# Patient Record
Sex: Male | Born: 1980 | Race: White | Hispanic: No | Smoking: Current every day smoker
Health system: Southern US, Community
[De-identification: ages and names within clinical notes are randomized; demographics above are authoritative.]

## PROBLEM LIST (undated history)

## (undated) DIAGNOSIS — R569 Unspecified convulsions: Secondary | ICD-10-CM

## (undated) HISTORY — PX: INGUINAL HERNIA REPAIR: SHX194

---

## 2014-10-10 ENCOUNTER — Encounter (HOSPITAL_COMMUNITY): Payer: Self-pay | Admitting: Emergency Medicine

## 2014-10-10 ENCOUNTER — Emergency Department (HOSPITAL_COMMUNITY)
Admission: EM | Admit: 2014-10-10 | Discharge: 2014-10-10 | Disposition: A | Discharge status: Home / Self Care | Payer: Self-pay | Attending: Emergency Medicine | Admitting: Emergency Medicine

## 2014-10-10 DIAGNOSIS — Z792 Long term (current) use of antibiotics: Secondary | ICD-10-CM | POA: Insufficient documentation

## 2014-10-10 DIAGNOSIS — K088 Other specified disorders of teeth and supporting structures: Secondary | ICD-10-CM | POA: Insufficient documentation

## 2014-10-10 DIAGNOSIS — R51 Headache: Secondary | ICD-10-CM | POA: Insufficient documentation

## 2014-10-10 DIAGNOSIS — Z720 Tobacco use: Secondary | ICD-10-CM | POA: Insufficient documentation

## 2014-10-10 DIAGNOSIS — H53149 Visual discomfort, unspecified: Secondary | ICD-10-CM | POA: Insufficient documentation

## 2014-10-10 DIAGNOSIS — R519 Headache, unspecified: Secondary | ICD-10-CM

## 2014-10-10 DIAGNOSIS — K0889 Other specified disorders of teeth and supporting structures: Secondary | ICD-10-CM

## 2014-10-10 DIAGNOSIS — Z8669 Personal history of other diseases of the nervous system and sense organs: Secondary | ICD-10-CM | POA: Insufficient documentation

## 2014-10-10 HISTORY — DX: Unspecified convulsions: R56.9

## 2014-10-10 MED ORDER — HYDROCODONE-ACETAMINOPHEN 5-325 MG PO TABS
1.0000 | ORAL_TABLET | Freq: Once | ORAL | Status: AC
  Administered 2014-10-10: 1 via ORAL
  Filled 2014-10-10: qty 1

## 2014-10-10 MED ORDER — HYDROCODONE-ACETAMINOPHEN 5-325 MG PO TABS: 1.0000 | ORAL_TABLET | ORAL | Status: DC | PRN

## 2014-10-10 MED ORDER — AMOXICILLIN 500 MG PO CAPS: 500.0000 mg | ORAL_CAPSULE | Freq: Three times a day (TID) | ORAL | Status: AC

## 2014-10-10 MED ORDER — HYDROMORPHONE HCL 1 MG/ML IJ SOLN
1.0000 mg | Freq: Once | INTRAMUSCULAR | Status: AC
Start: 2014-10-10 — End: 2014-10-10
  Administered 2014-10-10: 1 mg via INTRAMUSCULAR
  Filled 2014-10-10: qty 1

## 2014-10-10 MED ORDER — AMOXICILLIN 250 MG PO CAPS
500.0000 mg | ORAL_CAPSULE | Freq: Once | ORAL | Status: AC
  Administered 2014-10-10: 500 mg via ORAL
  Filled 2014-10-10: qty 2

## 2014-10-10 NOTE — ED Notes (Signed)
Pt verbalized understanding of no driving and to use caution within 4 hours of taking pain meds due to meds cause drowsiness 

## 2014-10-10 NOTE — ED Notes (Signed)
Pt states that he has had a bad headache for the past 4-5 days.

## 2014-10-10 NOTE — Discharge Instructions (Signed)
Dental Pain A tooth ache may be caused by cavities (tooth decay). Cavities expose the nerve of the tooth to air and hot or cold temperatures. It may come from an infection or abscess (also called a boil or furuncle) around your tooth. It is also often caused by dental caries (tooth decay). This causes the pain you are having. DIAGNOSIS  Your caregiver can diagnose this problem by exam. TREATMENT   If caused by an infection, it may be treated with medications which kill germs (antibiotics) and pain medications as prescribed by your caregiver. Take medications as directed.  Only take over-the-counter or prescription medicines for pain, discomfort, or fever as directed by your caregiver.  Whether the tooth ache today is caused by infection or dental disease, you should see your dentist as soon as possible for further care. SEEK MEDICAL CARE IF: The exam and treatment you received today has been provided on an emergency basis only. This is not a substitute for complete medical or dental care. If your problem worsens or new problems (symptoms) appear, and you are unable to meet with your dentist, call or return to this location. SEEK IMMEDIATE MEDICAL CARE IF:   You have a fever.  You develop redness and swelling of your face, jaw, or neck.  You are unable to open your mouth.  You have severe pain uncontrolled by pain medicine. MAKE SURE YOU:   Understand these instructions.  Will watch your condition.  Will get help right away if you are not doing well or get worse. Document Released: 05/13/2005 Document Revised: 08/05/2011 Document Reviewed: 12/30/2007 [facility] Patient Information 2015 [place], [place]. This information is not intended to replace advice given to you by your health care provider. Make sure you discuss any questions you have with your health care provider.  General Headache Without Cause A headache is pain or discomfort felt around the head or neck area. The specific cause  of a headache may not be found. There are many causes and types of headaches. A few common ones are:  Tension headaches.  Migraine headaches.  Cluster headaches.  Chronic daily headaches. HOME CARE INSTRUCTIONS   Keep all follow-up appointments with your caregiver or any specialist referral.  Only take over-the-counter or prescription medicines for pain or discomfort as directed by your caregiver.  Lie down in a dark, quiet room when you have a headache.  Keep a headache journal to find out what may trigger your migraine headaches. For example, write down:  What you eat and drink.  How much sleep you get.  Any change to your diet or medicines.  Try massage or other relaxation techniques.  Put ice packs or heat on the head and neck. Use these 3 to 4 times per day for 15 to 20 minutes each time, or as needed.  Limit stress.  Sit up straight, and do not tense your muscles.  Quit smoking if you smoke.  Limit alcohol use.  Decrease the amount of caffeine you drink, or stop drinking caffeine.  Eat and sleep on a regular schedule.  Get 7 to 9 hours of sleep, or as recommended by your caregiver.  Keep lights dim if bright lights bother you and make your headaches worse. SEEK MEDICAL CARE IF:   You have problems with the medicines you were prescribed.  Your medicines are not working.  You have a change from the usual headache.  You have nausea or vomiting. SEEK IMMEDIATE MEDICAL CARE IF:   Your headache becomes severe.  You have a fever.  You have a stiff neck.  You have loss of vision.  You have muscular weakness or loss of muscle control.  You start losing your balance or have trouble walking.  You feel faint or pass out.  You have severe symptoms that are different from your first symptoms. MAKE SURE YOU:   Understand these instructions.  Will watch your condition.  Will get help right away if you are not doing well or get worse. Document  Released: 05/13/2005 Document Revised: 08/05/2011 Document Reviewed: 05/29/2011 [facility] Patient Information 2015 [place], [place]. This information is not intended to replace advice given to you by your health care provider. Make sure you discuss any questions you have with your health care provider.   You may take the hydrocodone prescribed for pain relief.  This will make you drowsy - do not drive within 4 hours of taking this medication.  Complete your entire course of antibiotics as prescribed.   Avoid applying heat or ice to this abscess area which can worsen your symptoms.  You may use warm salt water swish and spit treatment or half peroxide and water swish and spit after meals to keep this area clean as discussed.  Call a dentist from the referral sheet given for further management of your dental problem.

## 2014-10-11 NOTE — ED Provider Notes (Signed)
CSN: [id number]     Arrival date & time 10/10/14  1649 History   First MD Initiated Contact with Patient 10/10/14 2054     Chief Complaint  Patient presents with  . Headache     (Consider location/radiation/quality/duration/timing/severity/associated sxs/prior Treatment) The history is provided by the patient and the spouse.   Brandon Sutton is a 34 y.o. male with a history of seizure disorder and chronic migraine headaches presenting with headache that has been present for the past 4-5 days. He states his symptoms are similar to prior headaches.  The patients symptoms are not preceded by prodromal symptoms.  The patient has left sided pain in association with photophobia.  There has been no fevers, chills, syncope, confusion or localized weakness.  The patient has tried tylenol and motrin without relief of symptoms.  His last seizure was over 6 years ago. He is on depakote, [place].  He also reports dental pain also starting 4 days ago while eating popcorn, stating this may have triggered the migraine as well.  He has areas of dental decay and has gingival swelling around his left upper canine tooth since biting into a popcorn kernel.        Past Medical History  Diagnosis Date  . Seizures    History reviewed. No pertinent past surgical history. History reviewed. No pertinent family history. History  Substance Use Topics  . Smoking status: Current Every Day Smoker -- 1.00 packs/day    Types: Cigarettes  . Smokeless tobacco: Not on file  . Alcohol Use: No    Review of Systems  Constitutional: Negative for fever and chills.  HENT: Positive for dental problem. Negative for facial swelling and sore throat.   Eyes: Positive for photophobia.  Respiratory: Negative for shortness of breath.   Musculoskeletal: Negative for neck pain and neck stiffness.  Neurological: Positive for headaches. Negative for dizziness, seizures, facial asymmetry,  weakness and numbness.      Allergies  Erythromycin  Home Medications   Prior to Admission medications   Medication Sig Start Date End Date Taking? Authorizing Provider  amoxicillin (AMOXIL) 500 MG capsule Take 1 capsule (500 mg total) by mouth 3 (three) times daily. 10/10/14 10/20/14  Burgess AmorJulie Topaz Raglin, PA-C  HYDROcodone-acetaminophen (NORCO/VICODIN) 5-325 MG per tablet Take 1 tablet by mouth every 4 (four) hours as needed. 10/10/14   Burgess AmorJulie Brewer Hitchman, PA-C   BP 128/77 mmHg  Pulse 73  Temp(Src) 98.6 F (37 C) (Oral)  Resp 18  Ht 5\' 8"  (1.727 m)  Wt 122 lb (55.339 kg)  BMI 18.55 kg/m2  SpO2 99% Physical Exam  Constitutional: He is oriented to person, place, and time. He appears well-developed and well-nourished.  Uncomfortable appearing  HENT:  Head: Normocephalic and atraumatic.  Mouth/Throat: Oropharynx is clear and moist. No trismus in the jaw. Abnormal dentition.    Eyes: EOM are normal. Pupils are equal, round, and reactive to light.  Neck: Normal range of motion. Neck supple.  Cardiovascular: Normal rate and normal heart sounds.   Pulmonary/Chest: Effort normal.  Abdominal: Soft. There is no tenderness.  Musculoskeletal: Normal range of motion.  Lymphadenopathy:    He has no cervical adenopathy.  Neurological: He is alert and oriented to person, place, and time. He has normal strength. No sensory deficit. Gait normal. GCS eye subscore is 4. GCS verbal subscore is 5. GCS motor subscore is 6.  Normal heel-shin, normal rapid alternating movements. Cranial nerves III-XII intact.  No pronator drift.  Skin: Skin is warm and dry. No rash noted.  Psychiatric: He has a normal mood and affect. His speech is normal and behavior is normal. Thought content normal. Cognition and memory are normal.  Nursing note and vitals reviewed.   ED Course  Procedures (including critical care time) Labs Review Labs Reviewed - No data to display  Imaging Review No results found.   EKG  Interpretation None      MDM   Final diagnoses:  Pain, dental  Acute nonintractable headache, unspecified headache type    Medications  HYDROcodone-acetaminophen (NORCO/VICODIN) 5-325 MG per tablet 1 tablet (1 tablet Oral Given 10/10/14 2120)  amoxicillin (AMOXIL) capsule 500 mg (500 mg Oral Given 10/10/14 2120)  HYDROmorphone (DILAUDID) injection 1 mg (1 mg Intramuscular Given 10/10/14 2208)   Pt deferred IM meds, so given hydrocodone tab with no pain relief.  Agreed to dilaudid injection, pain significantly improved by time of dc.  Will cover for probable early dental abscess. Amoxil started.  Prescribed hydrocodone if pain persists.  Dental referrals given, also referral to local neurology as pt has recently moved to this area.   No neuro deficit on exam or by history to suggest emergent presentation.           Burgess AmorJulie Chloie Loney, PA-C 10/11/14 1339  Nelva Nayobert Beaton, MD 10/13/14 [phone]

## 2018-08-15 ENCOUNTER — Other Ambulatory Visit: Payer: Self-pay

## 2018-08-15 ENCOUNTER — Encounter (HOSPITAL_COMMUNITY): Payer: Self-pay | Admitting: *Deleted

## 2018-08-15 ENCOUNTER — Emergency Department (HOSPITAL_COMMUNITY)
Admission: EM | Admit: 2018-08-15 | Discharge: 2018-08-15 | Disposition: A | Discharge status: Home / Self Care | Payer: BLUE CROSS/BLUE SHIELD | Attending: Emergency Medicine | Admitting: Emergency Medicine

## 2018-08-15 DIAGNOSIS — F17210 Nicotine dependence, cigarettes, uncomplicated: Secondary | ICD-10-CM | POA: Insufficient documentation

## 2018-08-15 DIAGNOSIS — R1084 Generalized abdominal pain: Secondary | ICD-10-CM | POA: Insufficient documentation

## 2018-08-15 DIAGNOSIS — Z7689 Persons encountering health services in other specified circumstances: Secondary | ICD-10-CM | POA: Diagnosis not present

## 2018-08-15 NOTE — ED Provider Notes (Signed)
[facility] EMERGENCY DEPARTMENT Provider Note   CSN: [id number] Arrival date & time: 08/15/18  1136    History   Chief Complaint Chief Complaint  Patient presents with  . Letter for School/Work    HPI Brandon Sutton is a 38 y.o. male with history of seizures presenting to emergency department today for chief complaint of requesting a work note.  Patient states he had intermittent cramping generalized abdominal pain,  2 episodes of non bloody and non bilious emesis, 3 episodes of diarrhea x3 days ago.  He stayed home from work.  He took Tylenol and Imodium and his symptoms improved.  Patient states in order to return to work he needs a note saying he does not have coronavirus.  Pt denies any pain or current symptoms.   Patient denies fever, chills, cough, sore throat, shortness of breath, abdominal pain, chest pain, urinary symptoms, nausea, vomiting.  History provided by patient.    Past Medical History:  Diagnosis Date  . Seizures (HCC)     There are no active problems to display for this patient.   History reviewed. No pertinent surgical history.      Home Medications    Prior to Admission medications   Medication Sig Start Date End Date Taking? Authorizing Provider  HYDROcodone-acetaminophen (NORCO/VICODIN) 5-325 MG per tablet Take 1 tablet by mouth every 4 (four) hours as needed. 10/10/14   Burgess Amor, PA-C    Family History History reviewed. No pertinent family history.  Social History Social History   Tobacco Use  . Smoking status: Current Every Day Smoker    Packs/day: 1.00    Types: Cigarettes  . Smokeless tobacco: Never Used  Substance Use Topics  . Alcohol use: No  . Drug use: No     Allergies   Erythromycin   Review of Systems Review of Systems  All other systems reviewed and are negative.    Physical Exam Updated Vital Signs BP (!) 119/97 (BP Location: Right Arm)   Pulse 76   Temp 98.3 F (36.8 C) (Oral)   Resp  17   Ht 5\' 8"  (1.727 m)   Wt 65.8 kg   SpO2 99%   BMI 22.05 kg/m   Physical Exam Vitals signs and nursing note reviewed.  Constitutional:      General: He is not in acute distress.    Appearance: He is well-developed. He is not ill-appearing or toxic-appearing.  HENT:     Head: Normocephalic and atraumatic.     Nose: Nose normal.  Eyes:     General: No scleral icterus.       Right eye: No discharge.        Left eye: No discharge.     Conjunctiva/sclera: Conjunctivae normal.  Neck:     Musculoskeletal: Normal range of motion.  Cardiovascular:     Rate and Rhythm: Normal rate and regular rhythm.     Pulses: Normal pulses.     Heart sounds: Normal heart sounds.  Pulmonary:     Effort: Pulmonary effort is normal.     Breath sounds: Normal breath sounds.  Abdominal:     General: There is no distension.     Palpations: Abdomen is soft.     Tenderness: There is no abdominal tenderness. There is no guarding or rebound.  Musculoskeletal: Normal range of motion.  Skin:    General: Skin is warm and dry.  Neurological:     Mental Status: He is oriented to person, place, and  time.     Comments: Fluent speech, no facial droop.  Psychiatric:        Behavior: Behavior normal.      ED Treatments / Results  Labs (all labs ordered are listed, but only abnormal results are displayed) Labs Reviewed - No data to display  EKG None  Radiology No results found.  Procedures Procedures (including critical care time)  Medications Ordered in ED Medications - No data to display   Initial Impression / Assessment and Plan / ED Course  I have reviewed the triage vital signs and the nursing notes.  Pertinent labs & imaging results that were available during my care of the patient were reviewed by me and considered in my medical decision making (see chart for details).   Pt is afebrile, well appearing, in no acute distress. He had abdominal pain, nausea, 2 episodes non bilious an  non bloody emesis, and 3 episodes of non bloody diarrhea that all resolved after Tylenol and 1 dose of imodium. Doubt need for further emergent work up at this time. I have given explicit precautions to return to the ER including for any other new or worsening symptoms. The patient understands and accepts the medical plan as it's been dictated and I have answered their questions. Discharge instructions concerning home care and prescriptions have been given. The patient is STABLE and is discharged to home in good condition.  Patient provided with a note to return to work.  Given that he has no recent travel, respiratory symptoms, or known Covid-19 exposures do not feel that he needs testing at this time.   Advised pt to monitor blood pressure at home and follow up with pcp if needed.   This note was prepared with assistance of Conservation officer, historic buildings. Occasional wrong-word or sound-a-like substitutions may have occurred due to the inherent limitations of voice recognition software.   Final Clinical Impressions(s) / ED Diagnoses   Final diagnoses:  Return to work evaluation    ED Discharge Orders    None       Kathyrn Lass 08/15/18 1215    Margarita Grizzle, MD 08/15/18 [phone]

## 2018-08-15 NOTE — ED Triage Notes (Signed)
Pt needs work note to return to work

## 2018-08-15 NOTE — ED Notes (Signed)
Patient verbalizes understanding of discharge instructions . Opportunity for questions and answers were provided . Armband removed by staff ,Pt discharged from ED. W/C  offered at D/C  and Declined W/C at D/C and was escorted to lobby by RN.  

## 2018-08-15 NOTE — Discharge Instructions (Addendum)
You have been seen today for work note. Please read and follow all provided instructions. Return to the emergency room for worsening condition or new concerning symptoms.    1. Medications:  Continue usual home medications Take medications as prescribed. Please review all of the medicines and only take them if you do not have an allergy to them.  2. Treatment: rest, drink plenty of fluids 3. Follow Up: Please follow up with your primary doctor in 2-5 days for discussion of your diagnoses and further evaluation after today's visit IF NEEDED; Call today to arrange your follow up.  If you do not have a primary care doctor use the resource guide provided to find one;   ?

## 2018-12-18 ENCOUNTER — Other Ambulatory Visit: Payer: Self-pay

## 2018-12-18 DIAGNOSIS — Z20822 Contact with and (suspected) exposure to covid-19: Secondary | ICD-10-CM

## 2018-12-22 LAB — NOVEL CORONAVIRUS, NAA: SARS-CoV-2, NAA: NOT DETECTED

## 2019-01-26 ENCOUNTER — Other Ambulatory Visit: Payer: Self-pay | Admitting: Adult Medicine

## 2019-01-26 DIAGNOSIS — M722 Plantar fascial fibromatosis: Secondary | ICD-10-CM

## 2019-02-22 ENCOUNTER — Other Ambulatory Visit: Payer: BLUE CROSS/BLUE SHIELD

## 2019-03-03 ENCOUNTER — Other Ambulatory Visit: Payer: BC Managed Care – PPO

## 2019-04-16 ENCOUNTER — Ambulatory Visit
Admission: RE | Admit: 2019-04-16 | Discharge: 2019-04-16 | Disposition: A | Discharge status: Home / Self Care | Payer: BC Managed Care – PPO | Source: Ambulatory Visit | Attending: Adult Medicine | Admitting: Adult Medicine

## 2019-04-16 ENCOUNTER — Other Ambulatory Visit: Payer: Self-pay | Admitting: Adult Medicine

## 2019-04-16 DIAGNOSIS — N200 Calculus of kidney: Secondary | ICD-10-CM

## 2020-01-17 ENCOUNTER — Emergency Department (HOSPITAL_COMMUNITY)
Admission: EM | Admit: 2020-01-17 | Discharge: 2020-01-18 | Disposition: A | Discharge status: Home / Self Care | Payer: BC Managed Care – PPO | Attending: Emergency Medicine | Admitting: Emergency Medicine

## 2020-01-17 ENCOUNTER — Other Ambulatory Visit: Payer: Self-pay

## 2020-01-17 DIAGNOSIS — R05 Cough: Secondary | ICD-10-CM | POA: Diagnosis not present

## 2020-01-17 DIAGNOSIS — R197 Diarrhea, unspecified: Secondary | ICD-10-CM | POA: Insufficient documentation

## 2020-01-17 DIAGNOSIS — Z5321 Procedure and treatment not carried out due to patient leaving prior to being seen by health care provider: Secondary | ICD-10-CM | POA: Insufficient documentation

## 2020-01-17 DIAGNOSIS — R0602 Shortness of breath: Secondary | ICD-10-CM | POA: Diagnosis not present

## 2020-01-17 DIAGNOSIS — M7918 Myalgia, other site: Secondary | ICD-10-CM | POA: Insufficient documentation

## 2020-01-17 DIAGNOSIS — Z20822 Contact with and (suspected) exposure to covid-19: Secondary | ICD-10-CM | POA: Diagnosis not present

## 2020-01-17 LAB — BASIC METABOLIC PANEL
Anion gap: 11 (ref 5–15)
BUN: 9 mg/dL (ref 6–20)
CO2: 23 mmol/L (ref 22–32)
Calcium: 9.2 mg/dL (ref 8.9–10.3)
Chloride: 107 mmol/L (ref 98–111)
Creatinine, Ser: 0.79 mg/dL (ref 0.61–1.24)
GFR calc Af Amer: >60 mL/min (ref >60)
GFR calc non Af Amer: >60 mL/min (ref >60)
Glucose, Bld: 91 mg/dL (ref 70–99)
Potassium: 4.2 mmol/L (ref 3.5–5.1)
Sodium: 141 mmol/L (ref 135–145)

## 2020-01-17 LAB — CBC
HCT: 43.4 % (ref 39.0–52.0)
Hemoglobin: 14.1 g/dL (ref 13.0–17.0)
MCH: 30.5 pg (ref 26.0–34.0)
MCHC: 32.5 g/dL (ref 30.0–36.0)
MCV: 93.7 fL (ref 80.0–100.0)
Platelets: 260 10*3/uL (ref 150–400)
RBC: 4.63 MIL/uL (ref 4.22–5.81)
RDW: 13 % (ref 11.5–15.5)
WBC: 10 10*3/uL (ref 4.0–10.5)
nRBC: 0 % (ref 0.0–0.2)

## 2020-01-17 NOTE — ED Triage Notes (Addendum)
Pt presents to ED POV. Pt c/o couhg, body aches, chills, diarrhea, SOB, weakness, rhinorrhea. S/s began x2d. Pt NAD in triage. Pt partially vaccinated

## 2020-01-18 LAB — SARS CORONAVIRUS 2 BY RT PCR (HOSPITAL ORDER, PERFORMED IN ~~LOC~~ HOSPITAL LAB): SARS Coronavirus 2: NEGATIVE

## 2020-01-18 NOTE — ED Notes (Signed)
Pt did not rsepond when called for vitals recheck and is not answering outside lobby as well

## 2020-05-28 ENCOUNTER — Emergency Department (HOSPITAL_COMMUNITY)
Admission: EM | Admit: 2020-05-28 | Discharge: 2020-05-28 | Disposition: A | Discharge status: Home / Self Care | Payer: BC Managed Care – PPO | Attending: Emergency Medicine | Admitting: Emergency Medicine

## 2020-05-28 ENCOUNTER — Encounter (HOSPITAL_COMMUNITY): Payer: Self-pay | Admitting: Emergency Medicine

## 2020-05-28 DIAGNOSIS — Z20822 Contact with and (suspected) exposure to covid-19: Secondary | ICD-10-CM | POA: Insufficient documentation

## 2020-05-28 DIAGNOSIS — Z5321 Procedure and treatment not carried out due to patient leaving prior to being seen by health care provider: Secondary | ICD-10-CM | POA: Insufficient documentation

## 2020-05-28 NOTE — ED Triage Notes (Signed)
Patient here after being notified yesterday that he was exposed to COVID sometime last week. Patient has no complaints and is asymptomatic. Patient is alert and oriented and in no apparent distress at this time.

## 2020-08-25 ENCOUNTER — Other Ambulatory Visit: Payer: Self-pay

## 2020-08-25 ENCOUNTER — Emergency Department (HOSPITAL_BASED_OUTPATIENT_CLINIC_OR_DEPARTMENT_OTHER)
Admission: EM | Admit: 2020-08-25 | Discharge: 2020-08-25 | Disposition: A | Discharge status: Home / Self Care | Payer: Self-pay | Attending: Emergency Medicine | Admitting: Emergency Medicine

## 2020-08-25 ENCOUNTER — Encounter (HOSPITAL_BASED_OUTPATIENT_CLINIC_OR_DEPARTMENT_OTHER): Payer: Self-pay | Admitting: *Deleted

## 2020-08-25 DIAGNOSIS — Z20822 Contact with and (suspected) exposure to covid-19: Secondary | ICD-10-CM | POA: Insufficient documentation

## 2020-08-25 DIAGNOSIS — R059 Cough, unspecified: Secondary | ICD-10-CM | POA: Insufficient documentation

## 2020-08-25 DIAGNOSIS — J3489 Other specified disorders of nose and nasal sinuses: Secondary | ICD-10-CM | POA: Insufficient documentation

## 2020-08-25 DIAGNOSIS — R519 Headache, unspecified: Secondary | ICD-10-CM | POA: Insufficient documentation

## 2020-08-25 DIAGNOSIS — R112 Nausea with vomiting, unspecified: Secondary | ICD-10-CM | POA: Insufficient documentation

## 2020-08-25 DIAGNOSIS — R000 Tachycardia, unspecified: Secondary | ICD-10-CM | POA: Insufficient documentation

## 2020-08-25 DIAGNOSIS — R509 Fever, unspecified: Secondary | ICD-10-CM | POA: Insufficient documentation

## 2020-08-25 DIAGNOSIS — J029 Acute pharyngitis, unspecified: Secondary | ICD-10-CM | POA: Insufficient documentation

## 2020-08-25 DIAGNOSIS — R0981 Nasal congestion: Secondary | ICD-10-CM | POA: Insufficient documentation

## 2020-08-25 DIAGNOSIS — M791 Myalgia, unspecified site: Secondary | ICD-10-CM | POA: Insufficient documentation

## 2020-08-25 LAB — SARS CORONAVIRUS 2 (TAT 6-24 HRS): SARS Coronavirus 2: NEGATIVE

## 2020-08-25 MED ORDER — ONDANSETRON HCL 4 MG/2ML IJ SOLN: 4.0000 mg | Freq: Once | INTRAMUSCULAR | Status: DC

## 2020-08-25 MED ORDER — PROMETHAZINE-CODEINE 6.25-10 MG/5ML PO SYRP: 5.0000 mL | ORAL_SOLUTION | Freq: Four times a day (QID) | ORAL | 0 refills | Status: DC | PRN

## 2020-08-25 MED ORDER — ACETAMINOPHEN 500 MG PO TABS
1000.0000 mg | ORAL_TABLET | Freq: Once | ORAL | Status: AC
Start: 2020-08-25 — End: 2020-08-25
  Administered 2020-08-25: 1000 mg via ORAL
  Filled 2020-08-25: qty 2

## 2020-08-25 NOTE — ED Provider Notes (Signed)
[facility] EMERGENCY DEPT Provider Note   CSN: [id number] Arrival date & time: 08/25/20  0844     History Chief Complaint  Patient presents with  . Fever  . Cough    Brandon Sutton is a 40 y.o. male.  The history is provided by the patient.  Fever Temp source:  Subjective Severity:  Moderate Onset quality:  Gradual Duration:  3 days Timing:  Constant Progression:  Worsening Chronicity:  New Relieved by:  Acetaminophen Worsened by:  Laying down Ineffective treatments:  None tried Associated symptoms: chills, congestion, cough, headaches, myalgias, nausea, rhinorrhea, sore throat and vomiting   Associated symptoms comment:  No SOB Risk factors: sick contacts   Risk factors comment:  Significant other tested positive for COVID 2 days ago Cough Associated symptoms: chills, fever, headaches, myalgias, rhinorrhea and sore throat        Past Medical History:  Diagnosis Date  . Seizures (HCC)     There are no problems to display for this patient.   History reviewed. No pertinent surgical history.     History reviewed. No pertinent family history.  Social History   Tobacco Use  . Smoking status: Current Every Day Smoker    Packs/day: 1.00    Types: Cigarettes  . Smokeless tobacco: Never Used  Substance Use Topics  . Alcohol use: No  . Drug use: No    Home Medications Prior to Admission medications   Medication Sig Start Date End Date Taking? Authorizing Provider  promethazine-codeine (PHENERGAN WITH CODEINE) 6.25-10 MG/5ML syrup Take 5 mLs by mouth every 6 (six) hours as needed for cough. 08/25/20  Yes Gwyneth Sprout, MD  HYDROcodone-acetaminophen (NORCO/VICODIN) 5-325 MG per tablet Take 1 tablet by mouth every 4 (four) hours as needed. 10/10/14   Burgess Amor, PA-C    Allergies    Erythromycin  Review of Systems   Review of Systems  Constitutional: Positive for chills and fever.  HENT: Positive for congestion, rhinorrhea and sore  throat.   Respiratory: Positive for cough.   Gastrointestinal: Positive for nausea and vomiting.  Musculoskeletal: Positive for myalgias.  Neurological: Positive for headaches.  All other systems reviewed and are negative.   Physical Exam Updated Vital Signs BP 120/80 (BP Location: Left Arm)   Pulse (!) 102   Temp (!) 100.4 F (38 C) (Oral)   Resp 18   Ht 5\' 8"  (1.727 m)   Wt 68 kg   SpO2 98%   BMI 22.81 kg/m   Physical Exam Vitals and nursing note reviewed.  Constitutional:      General: He is not in acute distress.    Appearance: Normal appearance. He is well-developed.  HENT:     Head: Normocephalic and atraumatic.  Eyes:     Conjunctiva/sclera: Conjunctivae normal.     Pupils: Pupils are equal, round, and reactive to light.  Cardiovascular:     Rate and Rhythm: Regular rhythm. Tachycardia present.     Pulses: Normal pulses.     Heart sounds: No murmur heard.   Pulmonary:     Effort: Pulmonary effort is normal. No respiratory distress.     Breath sounds: Normal breath sounds. No wheezing or rales.  Abdominal:     General: There is no distension.     Palpations: Abdomen is soft.     Tenderness: There is no abdominal tenderness. There is no guarding or rebound.  Musculoskeletal:        General: No tenderness. Normal range of motion.  Cervical back: Normal range of motion and neck supple.  Skin:    General: Skin is warm and dry.     Findings: No erythema or rash.  Neurological:     Mental Status: He is alert and oriented to person, place, and time. Mental status is at baseline.  Psychiatric:        Mood and Affect: Mood normal.        Behavior: Behavior normal.     ED Results / Procedures / Treatments   Labs (all labs ordered are listed, but only abnormal results are displayed) Labs Reviewed  SARS CORONAVIRUS 2 (TAT 6-24 HRS)    EKG None  Radiology No results found.  Procedures Procedures   Medications Ordered in ED Medications   acetaminophen (TYLENOL) tablet 1,000 mg (1,000 mg Oral Given 08/25/20 8299)    ED Course  I have reviewed the triage vital signs and the nursing notes.  Pertinent labs & imaging results that were available during my care of the patient were reviewed by me and considered in my medical decision making (see chart for details).    MDM Rules/Calculators/A&P                          Pt with symptoms consistent with viral syndrome most likely COVID.  Well appearing here.  No signs of breathing difficulty  No signs of pharyngitis, otitis or abnormal abdominal findings.  Lungs clear and no SOB.  Sats 97% on RA.  Significant other with +COVID 2 days ago.  Pt does have a history of epilepsy and is on Depakote.  He is otherwise low risk for developing severe Covid.  He does not meet qualifications for oral antiretrovirals and does not have insurance and would have to pay approximately $700 out-of-pocket.  Discussed with the patient that he should treat his symptoms supportively.  He was given cough suppressant.  Patient was given return precautions.  Final Clinical Impression(s) / ED Diagnoses Final diagnoses:  Suspected COVID-19 virus infection    Rx / DC Orders ED Discharge Orders         Ordered    promethazine-codeine (PHENERGAN WITH CODEINE) 6.25-10 MG/5ML syrup  Every 6 hours PRN        08/25/20 0935           Gwyneth Sprout, MD 08/25/20 [phone]

## 2020-08-25 NOTE — ED Triage Notes (Signed)
Coughing since Wednesday and an elevated temp (pt did not check his temp). Nausea and vomiting.

## 2020-12-20 ENCOUNTER — Emergency Department (HOSPITAL_BASED_OUTPATIENT_CLINIC_OR_DEPARTMENT_OTHER): Payer: Self-pay

## 2020-12-20 ENCOUNTER — Emergency Department (HOSPITAL_BASED_OUTPATIENT_CLINIC_OR_DEPARTMENT_OTHER)
Admission: EM | Admit: 2020-12-20 | Discharge: 2020-12-20 | Disposition: A | Discharge status: Home / Self Care | Payer: Self-pay | Attending: Emergency Medicine | Admitting: Emergency Medicine

## 2020-12-20 ENCOUNTER — Emergency Department (HOSPITAL_BASED_OUTPATIENT_CLINIC_OR_DEPARTMENT_OTHER): Payer: Self-pay | Admitting: Radiology

## 2020-12-20 ENCOUNTER — Other Ambulatory Visit: Payer: Self-pay

## 2020-12-20 ENCOUNTER — Encounter (HOSPITAL_BASED_OUTPATIENT_CLINIC_OR_DEPARTMENT_OTHER): Payer: Self-pay | Admitting: *Deleted

## 2020-12-20 DIAGNOSIS — F17210 Nicotine dependence, cigarettes, uncomplicated: Secondary | ICD-10-CM | POA: Insufficient documentation

## 2020-12-20 DIAGNOSIS — J181 Lobar pneumonia, unspecified organism: Secondary | ICD-10-CM | POA: Insufficient documentation

## 2020-12-20 DIAGNOSIS — J189 Pneumonia, unspecified organism: Secondary | ICD-10-CM

## 2020-12-20 DIAGNOSIS — R0781 Pleurodynia: Secondary | ICD-10-CM | POA: Insufficient documentation

## 2020-12-20 LAB — BASIC METABOLIC PANEL
Anion gap: 9 (ref 5–15)
BUN: 10 mg/dL (ref 6–20)
CO2: 24 mmol/L (ref 22–32)
Calcium: 8.5 mg/dL — ABNORMAL LOW (ref 8.9–10.3)
Chloride: 109 mmol/L (ref 98–111)
Creatinine, Ser: 0.59 mg/dL — ABNORMAL LOW (ref 0.61–1.24)
GFR, Estimated: >60 mL/min (ref >60)
Glucose, Bld: 87 mg/dL (ref 70–99)
Potassium: 3.6 mmol/L (ref 3.5–5.1)
Sodium: 142 mmol/L (ref 135–145)

## 2020-12-20 LAB — CBC
HCT: 38.8 % — ABNORMAL LOW (ref 39.0–52.0)
Hemoglobin: 13 g/dL (ref 13.0–17.0)
MCH: 30.4 pg (ref 26.0–34.0)
MCHC: 33.5 g/dL (ref 30.0–36.0)
MCV: 90.7 fL (ref 80.0–100.0)
Platelets: 238 10*3/uL (ref 150–400)
RBC: 4.28 MIL/uL (ref 4.22–5.81)
RDW: 13.7 % (ref 11.5–15.5)
WBC: 9.8 10*3/uL (ref 4.0–10.5)
nRBC: 0 % (ref 0.0–0.2)

## 2020-12-20 LAB — TROPONIN I (HIGH SENSITIVITY): Troponin I (High Sensitivity): 3 ng/L (ref <18)

## 2020-12-20 MED ORDER — AMOXICILLIN-POT CLAVULANATE 875-125 MG PO TABS: 1.0000 | ORAL_TABLET | Freq: Two times a day (BID) | ORAL | 0 refills | Status: DC

## 2020-12-20 MED ORDER — IOHEXOL 350 MG/ML SOLN
75.0000 mL | Freq: Once | INTRAVENOUS | Status: AC | PRN
  Administered 2020-12-20: 75 mL via INTRAVENOUS

## 2020-12-20 MED ORDER — KETOROLAC TROMETHAMINE 30 MG/ML IJ SOLN
30.0000 mg | Freq: Once | INTRAMUSCULAR | Status: AC
Start: 2020-12-20 — End: 2020-12-20
  Administered 2020-12-20: 30 mg via INTRAVENOUS
  Filled 2020-12-20: qty 1

## 2020-12-20 MED ORDER — NAPROXEN 500 MG PO TABS: 500.0000 mg | ORAL_TABLET | Freq: Two times a day (BID) | ORAL | 0 refills | Status: AC

## 2020-12-20 NOTE — ED Provider Notes (Signed)
[facility] EMERGENCY DEPARTMENT Provider Note  CSN: [id number] Arrival date & time: 12/20/20 1059    History Chief Complaint  Patient presents with   Chest Pain    Brandon Sutton is a 40 y.o. male with no significant PMH reports he was working on a car wheel 2 days ago, doing some pulling/lifting. Later that night he began havign severe aching pain in R upper chest and R back. Pain is worse with deep breath and movement. Pain has improved some since then but still present. No SOB, no leg swelling. No history of HTN, DM, or HLD. He has not had any recent travel. He does smoke.    Past Medical History:  Diagnosis Date   Seizures (HCC)     History reviewed. No pertinent surgical history.  No family history on file.  Social History   Tobacco Use   Smoking status: Every Day    Packs/day: 1.00    Types: Cigarettes   Smokeless tobacco: Never  Vaping Use   Vaping Use: Never used  Substance Use Topics   Alcohol use: No   Drug use: Yes    Types: Marijuana    Comment: last night     Home Medications Prior to Admission medications   Medication Sig Start Date End Date Taking? Authorizing Provider  amoxicillin-clavulanate (AUGMENTIN) 875-125 MG tablet Take 1 tablet by mouth every 12 (twelve) hours. 12/20/20  Yes Pollyann Savoy, MD  divalproex (DEPAKOTE) 500 MG DR tablet Take 500 mg by mouth daily.   Yes [provider]  divalproex (DEPAKOTE) 500 MG DR tablet Take 1,000 mg by mouth at bedtime. 06/26/20  Yes [provider]  naproxen (NAPROSYN) 500 MG tablet Take 1 tablet (500 mg total) by mouth 2 (two) times daily. 12/20/20  Yes Pollyann Savoy, MD     Allergies    Erythromycin   Review of Systems   Review of Systems A comprehensive review of systems was completed and negative except as noted in HPI.    Physical Exam BP (!) 141/105   Pulse 77   Temp 98.2 F (36.8 C) (Oral)   Resp 13   Ht 5\' 8"  (1.727 m)   Wt 46.7 kg   SpO2 100%    BMI 15.66 kg/m   Physical Exam Vitals and nursing note reviewed.  Constitutional:      Appearance: Normal appearance.  HENT:     Head: Normocephalic and atraumatic.     Nose: Nose normal.     Mouth/Throat:     Mouth: Mucous membranes are moist.  Eyes:     Extraocular Movements: Extraocular movements intact.     Conjunctiva/sclera: Conjunctivae normal.  Cardiovascular:     Rate and Rhythm: Normal rate.  Pulmonary:     Effort: Pulmonary effort is normal.     Breath sounds: Normal breath sounds.  Chest:     Chest wall: Tenderness (R upper chest and R thoracic back) present.  Abdominal:     General: Abdomen is flat.     Palpations: Abdomen is soft.     Tenderness: There is no abdominal tenderness.  Musculoskeletal:        General: No swelling. Normal range of motion.     Cervical back: Neck supple.  Skin:    General: Skin is warm and dry.  Neurological:     General: No focal deficit present.     Mental Status: He is alert.  Psychiatric:        Mood and  Affect: Mood normal.     ED Results / Procedures / Treatments   Labs (all labs ordered are listed, but only abnormal results are displayed) Labs Reviewed  BASIC METABOLIC PANEL - Abnormal; Notable for the following components:      Result Value   Creatinine, Ser 0.59 (*)    Calcium 8.5 (*)    All other components within normal limits  CBC - Abnormal; Notable for the following components:   HCT 38.8 (*)    All other components within normal limits  TROPONIN I (HIGH SENSITIVITY)    EKG EKG Interpretation  Date/Time:  Wednesday December 20 2020 11:00:47 EDT Ventricular Rate:  80 PR Interval:  166 QRS Duration: 78 QT Interval:  358 QTC Calculation: 412 R Axis:   77 Text Interpretation: Normal sinus rhythm Normal ECG No old tracing to compare Confirmed by Susy Frizzle [phone]) on 12/20/2020 11:29:19 AM   Radiology DG Chest 2 View  Result Date: 12/20/2020 CLINICAL DATA:  Chest pain EXAM: CHEST - 2 VIEW  COMPARISON:  Comparison exam is not available at the time of this dictation FINDINGS: Cardiac and mediastinal contours are within normal limits. Focal nodular opacity of the right upper lobe. Lungs otherwise clear. No pleural effusion pneumothorax. IMPRESSION: Focal nodular opacity of the right upper lobe. Correlate for clinical signs of infection and recommend follow-up chest x-ray in 4-6 weeks to ensure resolution. Electronically Signed   By: Allegra Lai MD   On: 12/20/2020 12:15   CT Angio Chest PE W/Cm &/Or Wo Cm  Result Date: 12/20/2020 CLINICAL DATA:  40 year old male with history chest pain. EXAM: CT ANGIOGRAPHY CHEST WITH CONTRAST TECHNIQUE: Multidetector CT imaging of the chest was performed using the standard protocol during bolus administration of intravenous contrast. Multiplanar CT image reconstructions and MIPs were obtained to evaluate the vascular anatomy. CONTRAST:  54mL OMNIPAQUE IOHEXOL 350 MG/ML SOLN COMPARISON:  Chest x-ray of the same date. FINDINGS: Cardiovascular: Aortic caliber is normal without signs of atherosclerotic change. Heart size is normal without pericardial effusion. Pulmonary arteries are well opacified with density of 524 Hounsfield units and no sign of pulmonary embolism. Mediastinum/Nodes: No thoracic inlet lymphadenopathy. No axillary lymphadenopathy. No mediastinal lymphadenopathy. Mildly enlarged precarinal lymph node (image 60/4) 1 cm. 14 mm RIGHT hilar lymph node (image 67/4) esophagus grossly normal. Lungs/Pleura: Patchy ground-glass and subtle nodularity surrounding a nodular area in the RIGHT upper lobe along the major fissure on image 64/6, this measures 2.0 x 1.8 cm. This appears less as a discrete nodule and more as a peripheral opacity on the coronal images. Additional nodule just below this measuring 1.3 x 0.8 cm. Mild paraseptal emphysema worse at the lung apices. Small pulmonary nodule (image 102/6) LEFT lower lobe 4-5 mm. Small pulmonary nodule RIGHT  upper lobe (image 22/6) 6 mm. No sign of dense consolidation or evidence of pleural effusion. Upper Abdomen: Incidental imaging of upper abdominal contents without acute process. Nephrolithiasis in the upper pole the LEFT kidney 4-5 mm calculus in this location. Adrenal glands are normal. Visualized portions of liver, spleen, pancreas and gastrointestinal tract without acute findings. Musculoskeletal: No acute musculoskeletal process. Spinal degenerative changes. Review of the MIP images confirms the above findings. IMPRESSION: 1. Nodularity along the major fissure in the RIGHT upper lobe with surrounding more indistinct ground-glass and nodules favors pneumonia. Given discrete nodular appearance would suggest follow-up in the short interval, 6-8 weeks to ensure continued decrease in size and exclude underlying neoplasm. 2. Mildly enlarged RIGHT hilar and precarinal  nodal tissue favored to be reactive. 3. Mild bronchial wall thickening in the RIGHT upper lobe favors inflammation. 4. No evidence of pulmonary embolism. 5. Mild paraseptal emphysema worse at the lung apices. 6. Nephrolithiasis in the upper pole the LEFT kidney. 7. Emphysema and aortic atherosclerosis. Aortic Atherosclerosis (ICD10-I70.0) and Emphysema (ICD10-J43.9). Electronically Signed   By: Donzetta Kohut M.D.   On: 12/20/2020 14:01    Procedures Procedures  Medications Ordered in the ED Medications  ketorolac (TORADOL) 30 MG/ML injection 30 mg (30 mg Intravenous Given 12/20/20 1147)  iohexol (OMNIPAQUE) 350 MG/ML injection 75 mL (75 mLs Intravenous Contrast Given 12/20/20 1312)     MDM Rules/Calculators/A&P MDM Patient with MSK vs pleuritic chest pain exam not consistent with pneumothorax. Normal vitals so doubt clinically significant PE given low risk factor profile. Low likelihood of ACS/CAD. Will check labs, CXR and give Toradol for pain relief.   ED Course  I have reviewed the triage vital signs and the nursing notes.  Pertinent  labs & imaging results that were available during my care of the patient were reviewed by me and considered in my medical decision making (see chart for details).  Clinical Course as of 12/20/20 1453  Wed Dec 20, 2020  1147 CBC, CMP and Trop are normal.  [CS]  1240 CXR reviewed, nodular opacity in R lateral lung. 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 Also consider wedge infarct but that would be less likely given normal vitals. Will send for CT to further clarify.  [CS]  1432 CTA is neg for PE, RUL consolidation is felt to be pneumonia per radiology. Will treat with Abx and recommend close outpatient follow up to ensure resolution and no signs of underlying mass. Patient and significant other at bedside aware of the importance of follow up. Patient advised to stop smoking.  [CS]    Clinical Course User Index [CS] Pollyann Savoy, MD    Final Clinical Impression(s) / ED Diagnoses Final diagnoses:  Community acquired pneumonia of right upper lobe of lung  Pleuritic chest pain    Rx / DC Orders ED Discharge Orders          Ordered    naproxen (NAPROSYN) 500 MG tablet  2 times daily        12/20/20 1434    amoxicillin-clavulanate (AUGMENTIN) 875-125 MG tablet  Every 12 hours        12/20/20 1434             Pollyann Savoy, MD 12/20/20 1453

## 2020-12-20 NOTE — ED Triage Notes (Signed)
Patient was pulling a car hub 2 days ago and his right chest radiating to his right upper back.

## 2021-02-26 ENCOUNTER — Emergency Department (HOSPITAL_BASED_OUTPATIENT_CLINIC_OR_DEPARTMENT_OTHER): Payer: Self-pay

## 2021-02-26 ENCOUNTER — Other Ambulatory Visit: Payer: Self-pay

## 2021-02-26 ENCOUNTER — Encounter (HOSPITAL_BASED_OUTPATIENT_CLINIC_OR_DEPARTMENT_OTHER): Payer: Self-pay | Admitting: Emergency Medicine

## 2021-02-26 ENCOUNTER — Emergency Department (HOSPITAL_BASED_OUTPATIENT_CLINIC_OR_DEPARTMENT_OTHER)
Admission: EM | Admit: 2021-02-26 | Discharge: 2021-02-26 | Disposition: A | Discharge status: Home / Self Care | Payer: Self-pay | Attending: Emergency Medicine | Admitting: Emergency Medicine

## 2021-02-26 DIAGNOSIS — R072 Precordial pain: Secondary | ICD-10-CM

## 2021-02-26 DIAGNOSIS — F17210 Nicotine dependence, cigarettes, uncomplicated: Secondary | ICD-10-CM | POA: Insufficient documentation

## 2021-02-26 DIAGNOSIS — K279 Peptic ulcer, site unspecified, unspecified as acute or chronic, without hemorrhage or perforation: Secondary | ICD-10-CM | POA: Insufficient documentation

## 2021-02-26 LAB — URINALYSIS, ROUTINE W REFLEX MICROSCOPIC
Bilirubin Urine: NEGATIVE
Glucose, UA: NEGATIVE mg/dL
Hgb urine dipstick: NEGATIVE
Ketones, ur: NEGATIVE mg/dL
Leukocytes,Ua: NEGATIVE
Nitrite: NEGATIVE
Protein, ur: NEGATIVE mg/dL
Specific Gravity, Urine: 1.02 (ref 1.005–1.030)
pH: 6 (ref 5.0–8.0)

## 2021-02-26 LAB — COMPREHENSIVE METABOLIC PANEL
ALT: 22 U/L (ref 0–44)
AST: 20 U/L (ref 15–41)
Albumin: 4.2 g/dL (ref 3.5–5.0)
Alkaline Phosphatase: 46 U/L (ref 38–126)
Anion gap: 10 (ref 5–15)
BUN: 13 mg/dL (ref 6–20)
CO2: 23 mmol/L (ref 22–32)
Calcium: 8.7 mg/dL — ABNORMAL LOW (ref 8.9–10.3)
Chloride: 109 mmol/L (ref 98–111)
Creatinine, Ser: 0.67 mg/dL (ref 0.61–1.24)
GFR, Estimated: >60 mL/min (ref >60)
Glucose, Bld: 141 mg/dL — ABNORMAL HIGH (ref 70–99)
Potassium: 4 mmol/L (ref 3.5–5.1)
Sodium: 142 mmol/L (ref 135–145)
Total Bilirubin: 0.3 mg/dL (ref 0.3–1.2)
Total Protein: 6.6 g/dL (ref 6.5–8.1)

## 2021-02-26 LAB — CBC
HCT: 42.9 % (ref 39.0–52.0)
Hemoglobin: 14 g/dL (ref 13.0–17.0)
MCH: 30.4 pg (ref 26.0–34.0)
MCHC: 32.6 g/dL (ref 30.0–36.0)
MCV: 93.3 fL (ref 80.0–100.0)
Platelets: 217 10*3/uL (ref 150–400)
RBC: 4.6 MIL/uL (ref 4.22–5.81)
RDW: 14.1 % (ref 11.5–15.5)
WBC: 13.3 10*3/uL — ABNORMAL HIGH (ref 4.0–10.5)
nRBC: 0 % (ref 0.0–0.2)

## 2021-02-26 LAB — LIPASE, BLOOD: Lipase: 75 U/L — ABNORMAL HIGH (ref 11–51)

## 2021-02-26 LAB — TROPONIN I (HIGH SENSITIVITY)
Troponin I (High Sensitivity): 4 ng/L (ref <18)
Troponin I (High Sensitivity): 3 ng/L (ref <18)

## 2021-02-26 MED ORDER — ASPIRIN 81 MG PO CHEW
324.0000 mg | CHEWABLE_TABLET | Freq: Once | ORAL | Status: AC
  Administered 2021-02-26: 324 mg via ORAL
  Filled 2021-02-26: qty 4

## 2021-02-26 MED ORDER — OMEPRAZOLE 20 MG PO CPDR: 20.0000 mg | DELAYED_RELEASE_CAPSULE | Freq: Every day | ORAL | 0 refills | Status: AC

## 2021-02-26 MED ORDER — LIDOCAINE VISCOUS HCL 2 % MT SOLN
15.0000 mL | Freq: Once | OROMUCOSAL | Status: AC
  Administered 2021-02-26: 15 mL via ORAL
  Filled 2021-02-26: qty 15

## 2021-02-26 MED ORDER — ALUM & MAG HYDROXIDE-SIMETH 200-200-20 MG/5ML PO SUSP
30.0000 mL | Freq: Once | ORAL | Status: AC
  Administered 2021-02-26: 30 mL via ORAL
  Filled 2021-02-26: qty 30

## 2021-02-26 MED ORDER — ONDANSETRON 8 MG PO TBDP: 8.0000 mg | ORAL_TABLET | Freq: Three times a day (TID) | ORAL | 0 refills | Status: AC | PRN

## 2021-02-26 MED ORDER — SUCRALFATE 1 G PO TABS: 1.0000 g | ORAL_TABLET | Freq: Three times a day (TID) | ORAL | 0 refills | Status: AC

## 2021-02-26 NOTE — ED Provider Notes (Signed)
[facility] EMERGENCY DEPT Provider Note   CSN: [id number] Arrival date & time: 02/26/21  0930     History Chief Complaint  Patient presents with   Emesis    Brandon Sutton is a 40 y.o. male.  HPI  HPI: A 40 year old patient presents for evaluation of chest pain. Initial onset of pain was less than one hour ago. The patient's chest pain is not worse with exertion. The patient complains of nausea. The patient's chest pain is middle- or left-sided, is not well-localized, is not described as heaviness/pressure/tightness, is not sharp and does radiate to the arms/jaw/neck. The patient denies diaphoresis. The patient has smoked in the past 90 days. The patient has no history of stroke, has no history of peripheral artery disease, denies any history of treated diabetes, has no relevant family history of coronary artery disease (first degree relative at less than age 33), is not hypertensive, has no history of hypercholesterolemia and does not have an elevated BMI (>=30).   Patient states that he woke up in the middle night with clammy feeling and chest discomfort.  Chest discomfort is sternal and central.  He describes the pain as dull pain which is coming and going.  Pain goes away in its entirety at times.  The pain is nonradiating.  No history of similar pain.  No known history of GERD.  The pain is not radiating, it is worse with him laying flat compared to sitting up.  Patient reports smoking a pack a day.  Denies any heavy alcohol use or illicit substance use.  Past Medical History:  Diagnosis Date   Seizures (HCC)     There are no problems to display for this patient.   Past Surgical History:  Procedure Laterality Date   INGUINAL HERNIA REPAIR         History reviewed. No pertinent family history.  Social History   Tobacco Use   Smoking status: Every Day    Packs/day: 1.00    Types: Cigarettes   Smokeless tobacco: Never  Vaping Use   Vaping Use: Never used   Substance Use Topics   Alcohol use: No   Drug use: Yes    Types: Marijuana    Comment: last night    Home Medications Prior to Admission medications   Medication Sig Start Date End Date Taking? Authorizing Provider  omeprazole (PRILOSEC) 20 MG capsule Take 1 capsule (20 mg total) by mouth daily. 02/26/21  Yes Nephtali Docken, MD  ondansetron (ZOFRAN ODT) 8 MG disintegrating tablet Take 1 tablet (8 mg total) by mouth every 8 (eight) hours as needed for nausea. 02/26/21  Yes Derwood Kaplan, MD  sucralfate (CARAFATE) 1 g tablet Take 1 tablet (1 g total) by mouth 4 (four) times daily -  with meals and at bedtime. 02/26/21  Yes Derwood Kaplan, MD  amoxicillin-clavulanate (AUGMENTIN) 875-125 MG tablet Take 1 tablet by mouth every 12 (twelve) hours. 12/20/20   Pollyann Savoy, MD  divalproex (DEPAKOTE) 500 MG DR tablet Take 500 mg by mouth daily.    [provider]  divalproex (DEPAKOTE) 500 MG DR tablet Take 1,000 mg by mouth at bedtime. 06/26/20   [provider]  naproxen (NAPROSYN) 500 MG tablet Take 1 tablet (500 mg total) by mouth 2 (two) times daily. 12/20/20   Pollyann Savoy, MD    Allergies    Erythromycin and Erythromycin base  Review of Systems   Review of Systems  Constitutional:  Positive for activity change.  Respiratory:  Negative for shortness of breath.   Cardiovascular:  Positive for chest pain.  Gastrointestinal:  Positive for nausea and vomiting.  Musculoskeletal:  Negative for back pain.  Neurological:  Negative for weakness, numbness and headaches.  All other systems reviewed and are negative.  Physical Exam Updated Vital Signs BP (!) 137/101   Pulse 80   Temp 98.8 F (37.1 C) (Oral)   Resp 18   Ht 5\' 8"  (1.727 m)   Wt 63.5 kg   SpO2 95%   BMI 21.29 kg/m   Physical Exam Vitals and nursing note reviewed.  Constitutional:      Appearance: He is well-developed.  HENT:     Head: Atraumatic.  Cardiovascular:     Rate and Rhythm:  Normal rate.     Pulses: Normal pulses.     Heart sounds: Normal heart sounds. No murmur heard.   No friction rub.     Comments: 2+ and equal radial pulse Pulmonary:     Effort: Pulmonary effort is normal.  Abdominal:     Palpations: There is no mass.     Tenderness: There is no abdominal tenderness.  Musculoskeletal:     Cervical back: Neck supple.  Skin:    General: Skin is warm.  Neurological:     Mental Status: He is alert and oriented to person, place, and time.    ED Results / Procedures / Treatments   Labs (all labs ordered are listed, but only abnormal results are displayed) Labs Reviewed  LIPASE, BLOOD - Abnormal; Notable for the following components:      Result Value   Lipase 75 (*)    All other components within normal limits  COMPREHENSIVE METABOLIC PANEL - Abnormal; Notable for the following components:   Glucose, Bld 141 (*)    Calcium 8.7 (*)    All other components within normal limits  CBC - Abnormal; Notable for the following components:   WBC 13.3 (*)    All other components within normal limits  URINALYSIS, ROUTINE W REFLEX MICROSCOPIC  TROPONIN I (HIGH SENSITIVITY)  TROPONIN I (HIGH SENSITIVITY)    EKG EKG Interpretation  Date/Time:  Monday February 26 2021 09:38:50 EDT Ventricular Rate:  79 PR Interval:  170 QRS Duration: 87 QT Interval:  358 QTC Calculation: 411 R Axis:   42 Text Interpretation: Sinus rhythm Probable left atrial enlargement No acute changes No significant change since last tracing Confirmed by [id number] Derwood Kaplan) on 02/26/2021 9:55:22 AM  Radiology DG Chest Portable 1 View  Result Date: 02/26/2021 CLINICAL DATA:  Chest pain EXAM: PORTABLE CHEST 1 VIEW COMPARISON:  Chest x-ray dated December 20, 2020 FINDINGS: The heart size and mediastinal contours are within normal limits. Interval resolution of previously seen right upper lobe nodular opacity. Both lungs are clear. The visualized skeletal structures are unremarkable.  IMPRESSION: No active disease. Interval resolution of previously seen right upper lobe nodular opacity. Electronically Signed   By: December 22, 2020 M.D.   On: 02/26/2021 10:25    Procedures Procedures     Medications Ordered in ED Medications  aspirin chewable tablet 324 mg (324 mg Oral Given 02/26/21 1006)  alum & mag hydroxide-simeth (MAALOX/MYLANTA) 200-200-20 MG/5ML suspension 30 mL (30 mLs Oral Given 02/26/21 1007)    And  lidocaine (XYLOCAINE) 2 % viscous mouth solution 15 mL (15 mLs Oral Given 02/26/21 1007)    ED Course  I have reviewed the triage vital signs and the nursing notes.  Pertinent labs & imaging results that  were available during my care of the patient were reviewed by me and considered in my medical decision making (see chart for details).  Clinical Course as of 02/26/21 1415  Mon Feb 26, 2021  1414 Lipase(!): 75 Lipase is slightly elevated.  Suspect it is because of PUD.  Unfortunately, patient's troponin is still pending and he is wanting to leave. We will discharge him at this time.  Clinical suspicion for ACS is further lower because of initial troponin being completely normal and lipase being slightly high.  We have discussed with him diagnosis of PUD as the presumed diagnosis and advised him to follow-up with his PCP.  ER return precautions also discussed. [AN]    Clinical Course User Index [AN] Derwood Kaplan, MD   MDM Rules/Calculators/A&P HEAR Score: 2                         DDx includes: Pancreatitis Hepatobiliary pathology including cholecystitis Gastritis/PUD SBO ACS syndrome Aortic Dissection Esophageal spasms  Patient comes in with chief complaint of epigastric abdominal pain.  He has been having pain since 3 AM.  No history of GERD.  Pain is worse with laying down, better when he sits up.  He however has no evidence of pericarditis on EKG and there is no pleurisy or pericardial friction or rub.  Clinically, I do not think he has  pericarditis.   PUD/esophagitis/esophageal spasm is higher in the differential diagnosis. We can rule out ACS as well.  Normal vascular exam, lack of neurologic symptoms and no back pain reassuring.  The pain is also intermittent which makes dissection less likely.  Smoking cessation instruction/counseling given:  counseled patient on the dangers of tobacco use, advised patient to stop smoking, and reviewed strategies to maximize success    Final Clinical Impression(s) / ED Diagnoses Final diagnoses:  PUD (peptic ulcer disease)  Precordial chest pain    Rx / DC Orders ED Discharge Orders          Ordered    omeprazole (PRILOSEC) 20 MG capsule  Daily        02/26/21 1407    ondansetron (ZOFRAN ODT) 8 MG disintegrating tablet  Every 8 hours PRN        02/26/21 1407    sucralfate (CARAFATE) 1 g tablet  3 times daily with meals & bedtime        02/26/21 1407             Derwood Kaplan, MD 02/26/21 1415

## 2021-02-26 NOTE — ED Triage Notes (Signed)
Pt arrives to ED with c/o of emesis. Pt reports he woke up at 3am this morning with a "cold" feeling in is epigastric area. Pt reports that he vomited three times. He reports that vomiting helped his symptoms. He is not currently nauseous. He reports eating pizza, cheez-itz, and cookies before bed. No fevers.

## 2021-02-26 NOTE — ED Notes (Signed)
Patient verbalizes understanding of discharge instructions. Opportunity for questioning and answers were provided. Patient discharged from ED.  °

## 2021-02-26 NOTE — Discharge Instructions (Addendum)
We saw in the ER for pain.  It appears to [place] that you likely have peptic ulcer disease.  Start taking the medications that are prescribed. Follow-up with your primary care doctor in 1 week.  Please return to the ER if your symptoms worsen; you have increased pain, fevers, chills, inability to keep any medications down, severe chest pain or shortness of breath. Otherwise see the outpatient doctor as requested.

## 2022-05-30 ENCOUNTER — Encounter (HOSPITAL_BASED_OUTPATIENT_CLINIC_OR_DEPARTMENT_OTHER): Payer: Self-pay | Admitting: Emergency Medicine

## 2022-05-30 ENCOUNTER — Emergency Department (HOSPITAL_BASED_OUTPATIENT_CLINIC_OR_DEPARTMENT_OTHER)
Admission: EM | Admit: 2022-05-30 | Discharge: 2022-05-30 | Disposition: A | Discharge status: Home / Self Care | Payer: Self-pay | Attending: Emergency Medicine | Admitting: Emergency Medicine

## 2022-05-30 DIAGNOSIS — H5711 Ocular pain, right eye: Secondary | ICD-10-CM | POA: Insufficient documentation

## 2022-05-30 DIAGNOSIS — H02843 Edema of right eye, unspecified eyelid: Secondary | ICD-10-CM | POA: Insufficient documentation

## 2022-05-30 MED ORDER — CEPHALEXIN 500 MG PO CAPS: 500.0000 mg | ORAL_CAPSULE | Freq: Four times a day (QID) | ORAL | 0 refills | Status: AC

## 2022-05-30 MED ORDER — POLYMYXIN B-TRIMETHOPRIM 10000-0.1 UNIT/ML-% OP SOLN
1.0000 [drp] | Freq: Once | OPHTHALMIC | Status: AC
  Administered 2022-05-30: 1 [drp] via OPHTHALMIC
  Filled 2022-05-30: qty 10

## 2022-05-30 MED ORDER — CEPHALEXIN 500 MG PO CAPS: 500.0000 mg | ORAL_CAPSULE | Freq: Four times a day (QID) | ORAL | 0 refills | Status: DC

## 2022-05-30 NOTE — Discharge Instructions (Signed)
Please read and follow all provided instructions.  Your diagnoses today include:  1. Swelling of right eyelid     Tests performed today include: Vital signs. See below for your results today.   Medications prescribed:  Polytrim (polymyxin B/trimethoprim) - antibiotic eye drops  Use this medication as follows: Use 1 drop in affected eye every 3 hours while awake for 10 days. Do not exceed 6 doses in 24 hours.  Keflex (cephalexin) - antibiotic  You have been prescribed an antibiotic medicine: take the entire course of medicine even if you are feeling better. Stopping early can cause the antibiotic not to work.  Take any prescribed medications only as directed.   Home care instructions:  Follow any educational materials contained in this packet. Keep affected area above the level of your heart when possible. Wash area gently twice a day with warm soapy water. Do not apply alcohol or hydrogen peroxide. Cover the area if it draining or weeping.   Follow-up instructions: Please follow-up with your primary care provider in the next 3-5 days for further evaluation of your symptoms.   Return instructions:  Return to the Emergency Department if you have: Swelling that starts to extend all the way around the eye Vision change Fever Worsening symptoms Worsening pain Worsening swelling Redness of the skin that moves away from the affected area, especially if it streaks away from the affected area  Any other emergent concerns  Your vital signs today were: BP 129/89 (BP Location: Right Arm)   Pulse 84   Temp 99.3 F (37.4 C) (Oral)   Resp 20   SpO2 100%  If your blood pressure (BP) was elevated above 135/85 this visit, please have this repeated by your doctor within one month. --------------

## 2022-05-30 NOTE — ED Provider Triage Note (Signed)
Emergency Medicine Provider Triage Evaluation Note  Brandon Sutton , a 42 y.o. male  was evaluated in triage.  Pt complains of right upper eyelid swelling/pain starting yesterday.  He initially had some pain but awoke with swelling this morning.  No vision changes or eye drainage.  No injuries or foreign bodies into the eye.  Review of Systems  Positive: Right upper eyelid swelling Negative: Vision change  Physical Exam  BP 129/89 (BP Location: Right Arm)   Pulse 99   Temp 99.1 F (37.3 C) (Oral)   Resp 20   SpO2 99%  Gen:   Awake, no distress   Resp:  Normal effort  MSK:   Moves extremities without difficulty  Other:  Redness and swelling to the right upper eyelid, does not appear to be periorbital, globe appears normal  Medical Decision Making  Medically screening exam initiated at 4:10 PM.  Appropriate orders placed.  Mate Alegria was informed that the remainder of the evaluation will be completed by another provider, this initial triage assessment does not replace that evaluation, and the importance of remaining in the ED until their evaluation is complete.     Carlisle Cater, PA-C 05/30/22 [id number]

## 2022-05-30 NOTE — ED Notes (Signed)
Pt verbalized understanding of d/c instructions, meds, and followup care. Denies questions. VSS, no distress noted. Steady gait to exit with all belongings.  ?

## 2022-05-30 NOTE — ED Notes (Signed)
Visual Acuity R 20/10 L 20/10 No corrective lenses used.

## 2022-05-30 NOTE — ED Triage Notes (Signed)
Right eye hurting ,eyelid swollen for a day.

## 2022-05-30 NOTE — ED Provider Notes (Signed)
[facility] EMERGENCY DEPT Provider Note   CSN: [id number] Arrival date & time: 05/30/22  1549     History  Chief Complaint  Patient presents with   Eye Problem    Brandon Sutton is a 42 y.o. male.  Patient was seen by myself on arrival in triage.  Pt complains of right upper eyelid swelling/pain starting yesterday.  He initially had some pain but awoke with swelling this morning.  No vision changes or eye drainage.  No injuries or foreign bodies into the eye.  Denies history of corrective lens use or eye surgery.  No fevers.       Home Medications Prior to Admission medications   Medication Sig Start Date End Date Taking? Authorizing Provider  cephALEXin (KEFLEX) 500 MG capsule Take 1 capsule (500 mg total) by mouth 4 (four) times daily. 05/30/22  Yes Carlisle Cater, PA-C  divalproex (DEPAKOTE) 500 MG DR tablet Take 500 mg by mouth daily.    [provider]  divalproex (DEPAKOTE) 500 MG DR tablet Take 1,000 mg by mouth at bedtime. 06/26/20   [provider]  naproxen (NAPROSYN) 500 MG tablet Take 1 tablet (500 mg total) by mouth 2 (two) times daily. 12/20/20   Truddie Hidden, MD  omeprazole (PRILOSEC) 20 MG capsule Take 1 capsule (20 mg total) by mouth daily. 02/26/21   Varney Biles, MD  ondansetron (ZOFRAN ODT) 8 MG disintegrating tablet Take 1 tablet (8 mg total) by mouth every 8 (eight) hours as needed for nausea. 02/26/21   Varney Biles, MD  sucralfate (CARAFATE) 1 g tablet Take 1 tablet (1 g total) by mouth 4 (four) times daily -  with meals and at bedtime. 02/26/21   Varney Biles, MD      Allergies    Erythromycin and Erythromycin base    Review of Systems   Review of Systems  Physical Exam Updated Vital Signs BP 129/89 (BP Location: Right Arm)   Pulse 84   Temp 99.3 F (37.4 C) (Oral)   Resp 20   SpO2 100%  Physical Exam Vitals and nursing note reviewed.  Constitutional:      Appearance: He is well-developed.  HENT:      Head: Normocephalic and atraumatic.     Right Ear: Tympanic membrane, ear canal and external ear normal.     Left Ear: Tympanic membrane, ear canal and external ear normal.     Nose: No congestion or rhinorrhea.     Mouth/Throat:     Mouth: Mucous membranes are moist.  Eyes:     Conjunctiva/sclera:     Right eye: Right conjunctiva is not injected. No chemosis, exudate or hemorrhage.    Left eye: Left conjunctiva is not injected. No chemosis, exudate or hemorrhage.    Comments: Patient with right upper eyelid erythema and edema which extends slightly onto the orbital rim.  This is not periorbital.  Globe appears normal.  No nodularity noted of the right upper eyelid.  Pulmonary:     Effort: No respiratory distress.  Musculoskeletal:     Cervical back: Normal range of motion and neck supple.  Skin:    General: Skin is warm and dry.  Neurological:     Mental Status: He is alert.     ED Results / Procedures / Treatments   Labs (all labs ordered are listed, but only abnormal results are displayed) Labs Reviewed - No data to display  EKG None  Radiology No results found.  Procedures Procedures  Medications Ordered in ED Medications  trimethoprim-polymyxin b (POLYTRIM) ophthalmic solution 1 drop (has no administration in time range)    ED Course/ Medical Decision Making/ A&P    Patient seen and examined. History obtained directly from patient. Work-up including labs, imaging, EKG ordered in triage, if performed, were reviewed.    Labs/EKG: None ordered  Imaging: None ordered  Medications/Fluids: Ordered: Polytrim  Most recent vital signs reviewed and are as follows: BP 129/89 (BP Location: Right Arm)   Pulse 84   Temp 99.3 F (37.4 C) (Oral)   Resp 20   SpO2 100%   Initial impression: Right upper eyelid swelling, possible hordeolum  Home treatment plan: Eyedrops and warm compresses.  Patient is prescribed a course of Keflex.  Discussed that he could start  this immediately versus try the eyedrops and warm compresses for 48 hours and take if worsening or not improving at that time.  Return instructions discussed with patient: Encouraged return to the emergency department with worsening pain, swelling, redness that extends around the eye, vision changes, fevers.  Follow-up instructions discussed with patient: Follow-up with PCP for recheck in 3 to 5 days if not improving.                          Medical Decision Making Risk Prescription drug management.   Patient with right upper eyelid swelling, likely related to hordeolum, less likely cellulitis.  No foreign bodies noted. No surrounding erythema, swelling, vision changes/loss suspicious for orbital cellulitis. No signs of iritis.  Low concern for glaucoma.  No symptoms of retinal detachment. No ophthalmologic emergency suspected.   The patient's vital signs, pertinent lab work and imaging were reviewed and interpreted as discussed in the ED course. Hospitalization was considered for further testing, treatments, or serial exams/observation. However as patient is well-appearing, has a stable exam, and reassuring studies today, I do not feel that they warrant admission at this time. This plan was discussed with the patient who verbalizes agreement and comfort with this plan and seems reliable and able to return to the Emergency Department with worsening or changing symptoms.          Final Clinical Impression(s) / ED Diagnoses Final diagnoses:  Swelling of right eyelid    Rx / DC Orders ED Discharge Orders          Ordered    cephALEXin (KEFLEX) 500 MG capsule  4 times daily        05/30/22 1901              Carlisle Cater, Hershal Coria 05/30/22 1906    Varney Biles, MD 05/30/22 [phone]

## 2022-09-23 IMAGING — DX DG CHEST 1V PORT
1 series · 1 of 1 positions shown · non-contrast
Comparison: Chest x-ray dated December 20, 2020

CLINICAL DATA: Chest pain

EXAM:
PORTABLE CHEST 1 VIEW

[chest]
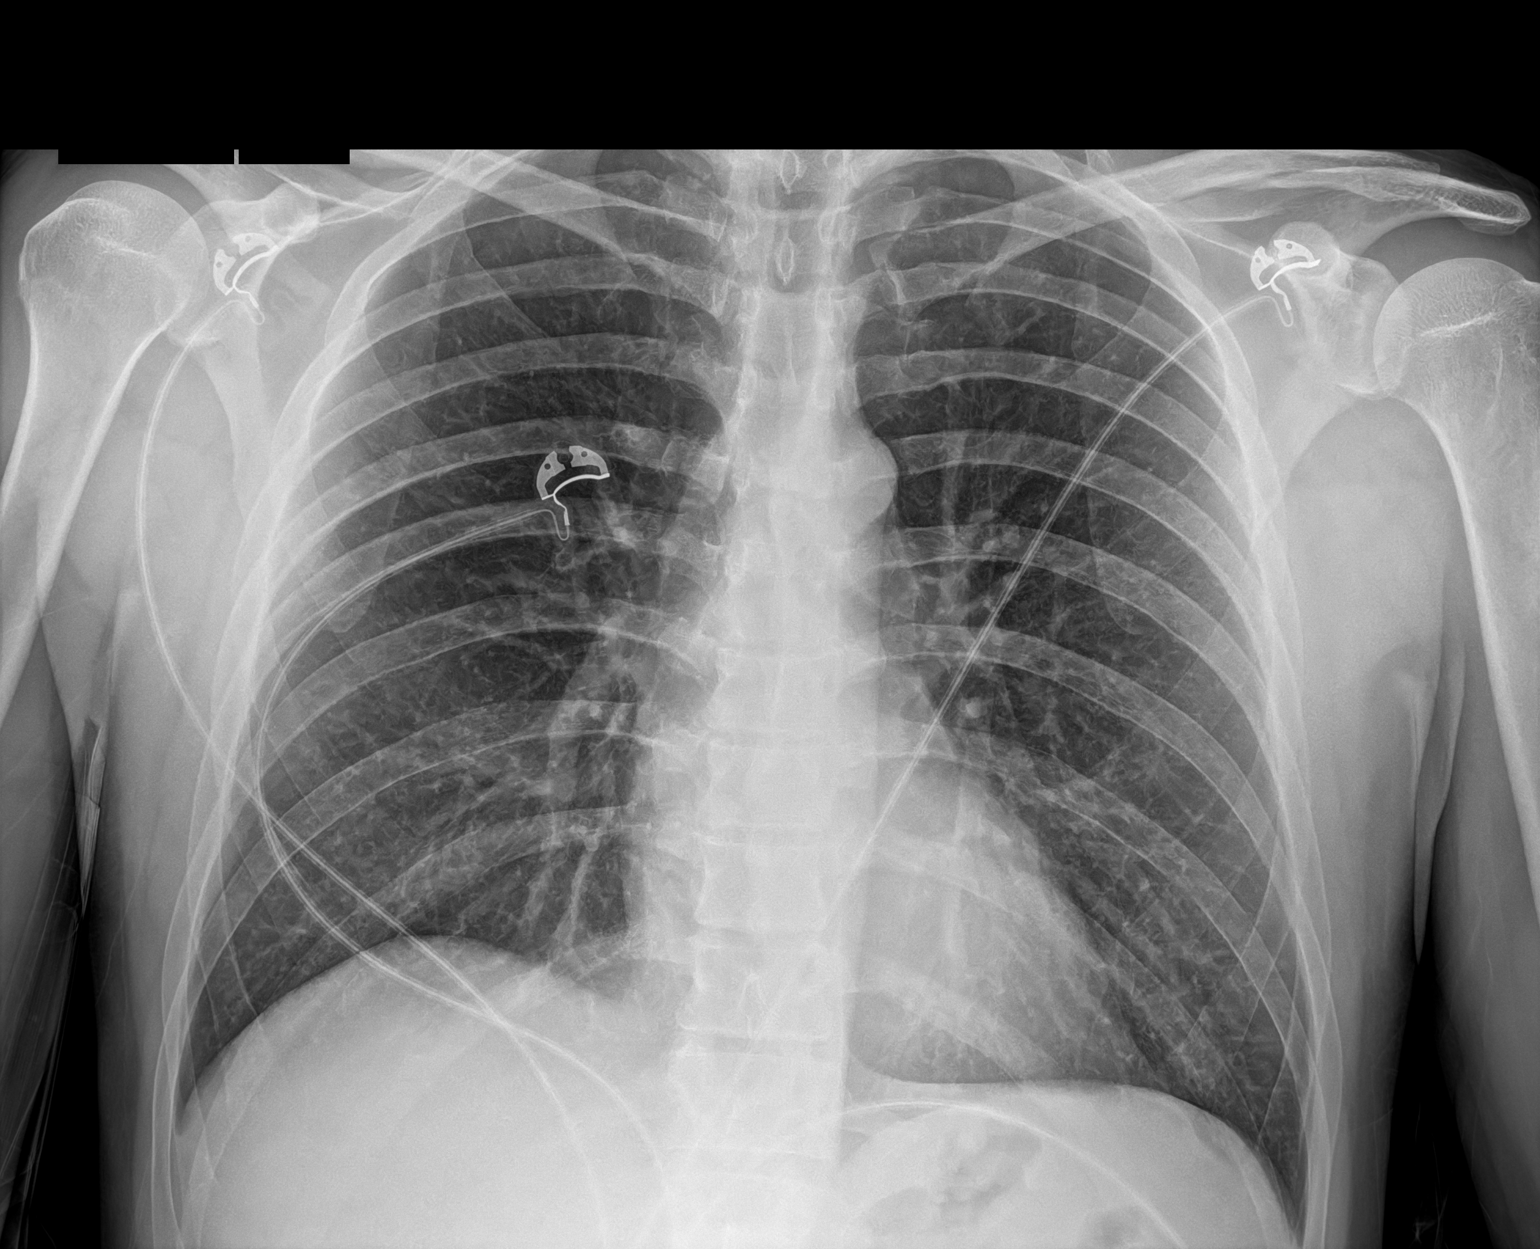

[1 of 1 positions shown; findings below may reference images not displayed]

FINDINGS: The heart size and mediastinal contours are within normal limits.
Interval resolution of previously seen right upper lobe nodular
opacity. Both lungs are clear. The visualized skeletal structures
are unremarkable.
IMPRESSION: No active disease.

Interval resolution of previously seen right upper lobe nodular
opacity.
